# Patient Record
Sex: Female | Born: 1990 | Race: White | Hispanic: No | Marital: Single | Smoking: Never smoker
Health system: Southern US, Community
[De-identification: ages and names within clinical notes are randomized; demographics above are authoritative.]

---
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
## 2009-09-11 ENCOUNTER — Other Ambulatory Visit: Admission: RE | Admit: 2009-09-11 | Discharge: 2009-09-11 | Payer: Self-pay | Admitting: Obstetrics and Gynecology

## 2009-09-29 ENCOUNTER — Emergency Department (HOSPITAL_COMMUNITY): Admission: EM | Admit: 2009-09-29 | Discharge: 2009-09-29 | Payer: Self-pay | Admitting: Family Medicine

## 2010-07-11 ENCOUNTER — Other Ambulatory Visit: Admission: RE | Admit: 2010-07-11 | Discharge: 2010-07-11 | Payer: Self-pay | Admitting: Obstetrics and Gynecology

## 2010-08-29 ENCOUNTER — Emergency Department (HOSPITAL_COMMUNITY): Admission: EM | Admit: 2010-08-29 | Discharge: 2010-08-29 | Payer: Self-pay | Admitting: Family Medicine

## 2011-03-16 ENCOUNTER — Other Ambulatory Visit (HOSPITAL_COMMUNITY)
Admission: RE | Admit: 2011-03-16 | Discharge: 2011-03-16 | Disposition: A | Discharge status: Home / Self Care | Payer: BC Managed Care – PPO | Source: Ambulatory Visit | Attending: Obstetrics and Gynecology | Admitting: Obstetrics and Gynecology

## 2011-03-16 DIAGNOSIS — Z113 Encounter for screening for infections with a predominantly sexual mode of transmission: Secondary | ICD-10-CM | POA: Insufficient documentation

## 2011-03-16 DIAGNOSIS — Z01419 Encounter for gynecological examination (general) (routine) without abnormal findings: Secondary | ICD-10-CM | POA: Insufficient documentation

## 2011-03-16 DIAGNOSIS — M542 Cervicalgia: Secondary | ICD-10-CM | POA: Insufficient documentation

## 2012-03-10 ENCOUNTER — Other Ambulatory Visit (HOSPITAL_COMMUNITY)
Admission: RE | Admit: 2012-03-10 | Discharge: 2012-03-10 | Disposition: A | Discharge status: Home / Self Care | Payer: 59 | Source: Ambulatory Visit | Attending: Obstetrics and Gynecology | Admitting: Obstetrics and Gynecology

## 2012-03-10 DIAGNOSIS — Z113 Encounter for screening for infections with a predominantly sexual mode of transmission: Secondary | ICD-10-CM | POA: Insufficient documentation

## 2012-03-10 DIAGNOSIS — Z01419 Encounter for gynecological examination (general) (routine) without abnormal findings: Secondary | ICD-10-CM | POA: Insufficient documentation

## 2012-09-27 ENCOUNTER — Encounter (HOSPITAL_COMMUNITY): Payer: Self-pay | Admitting: *Deleted

## 2012-09-27 ENCOUNTER — Emergency Department (HOSPITAL_COMMUNITY)
Admission: EM | Admit: 2012-09-27 | Discharge: 2012-09-27 | Disposition: A | Discharge status: Home / Self Care | Payer: 59 | Source: Home / Self Care | Attending: Family Medicine | Admitting: Family Medicine

## 2012-09-27 DIAGNOSIS — J069 Acute upper respiratory infection, unspecified: Secondary | ICD-10-CM

## 2012-09-27 MED ORDER — AZITHROMYCIN 250 MG PO TABS: ORAL_TABLET | ORAL | Status: DC

## 2012-09-27 MED ORDER — DEXTROMETHORPHAN POLISTIREX 30 MG/5ML PO LQCR: 60.0000 mg | ORAL | Status: DC | PRN

## 2012-09-27 NOTE — ED Provider Notes (Signed)
History     CSN: [id number]  Arrival date & time 09/27/12  0802   First MD Initiated Contact with Patient 09/27/12 0809      Chief Complaint  Patient presents with  . Cough    (Consider location/radiation/quality/duration/timing/severity/associated sxs/prior treatment) Patient is a 21 y.o. female presenting with cough. The history is provided by the patient.  Cough This is a new problem. The current episode started more than 1 week ago (2 weeks of sx). The problem has not changed since onset.The cough is non-productive. There has been no fever. Pertinent negatives include no rhinorrhea, no sore throat, no shortness of breath and no wheezing. She is not a smoker.    History reviewed. No pertinent past medical history.  History reviewed. No pertinent past surgical history.  No family history on file.  History  Substance Use Topics  . Smoking status: Never Smoker   . Smokeless tobacco: Not on file  . Alcohol Use: Yes     Comment: occasonally    OB History    Grav Para Term Preterm Abortions TAB SAB Ect Mult Living                  Review of Systems  HENT: Negative for congestion, sore throat, rhinorrhea and postnasal drip.   Respiratory: Positive for cough. Negative for shortness of breath and wheezing.   Cardiovascular: Negative.   Gastrointestinal: Negative.     Allergies  Review of patient's allergies indicates not on file.  Home Medications   Current Outpatient Rx  Name  Route  Sig  Dispense  Refill  . DROSPIRENONE-ETHINYL ESTRADIOL 3-0.03 MG PO TABS   Oral   Take 1 tablet by mouth daily.         Marland Kitchen CLARITIN PO   Oral   Take by mouth.         . AZITHROMYCIN 250 MG PO TABS      Take as directed on pack   6 each   0   . DEXTROMETHORPHAN POLISTIREX ER 30 MG/5ML PO LQCR   Oral   Take 10 mLs (60 mg total) by mouth as needed for cough.   89 mL   0     BP 133/79  Pulse 95  Temp 99.2 F (37.3 C) (Oral)  Resp 12  SpO2 98%  Physical Exam   Nursing note and vitals reviewed. Constitutional: She is oriented to person, place, and time. She appears well-developed and well-nourished.  HENT:  Head: Normocephalic.  Right Ear: External ear normal.  Left Ear: External ear normal.  Mouth/Throat: Oropharynx is clear and moist.  Eyes: Pupils are equal, round, and reactive to light.  Neck: Normal range of motion. Neck supple.  Cardiovascular: Normal rate, regular rhythm, normal heart sounds and intact distal pulses.   Pulmonary/Chest: Effort normal and breath sounds normal.  Abdominal: Soft. Bowel sounds are normal.  Lymphadenopathy:    She has no cervical adenopathy.  Neurological: She is alert and oriented to person, place, and time.  Skin: Skin is warm and dry.    ED Course  Procedures (including critical care time)  Labs Reviewed - No data to display No results found.   1. URI (upper respiratory infection)       MDM          Linna Hoff, MD 09/27/12 [phone]

## 2012-09-27 NOTE — ED Notes (Signed)
Pt  Reports  Symptoms  Of a  Non  Productive   Cough  With sinus   Congestion and  Pressure     With a  scratchy  Throat        Symptoms   X  2  Weeks  - not releived  By  otc  meds

## 2013-03-27 ENCOUNTER — Other Ambulatory Visit (HOSPITAL_COMMUNITY)
Admission: RE | Admit: 2013-03-27 | Discharge: 2013-03-27 | Disposition: A | Discharge status: Home / Self Care | Payer: 59 | Source: Ambulatory Visit | Attending: Obstetrics and Gynecology | Admitting: Obstetrics and Gynecology

## 2013-03-27 ENCOUNTER — Other Ambulatory Visit: Payer: Self-pay | Admitting: Obstetrics and Gynecology

## 2013-03-27 DIAGNOSIS — Z01419 Encounter for gynecological examination (general) (routine) without abnormal findings: Secondary | ICD-10-CM | POA: Insufficient documentation

## 2014-04-05 ENCOUNTER — Other Ambulatory Visit: Payer: Self-pay | Admitting: Obstetrics and Gynecology

## 2014-04-05 ENCOUNTER — Other Ambulatory Visit (HOSPITAL_COMMUNITY)
Admission: RE | Admit: 2014-04-05 | Discharge: 2014-04-05 | Disposition: A | Discharge status: Home / Self Care | Payer: BC Managed Care – PPO | Source: Ambulatory Visit | Attending: Obstetrics and Gynecology | Admitting: Obstetrics and Gynecology

## 2014-04-05 DIAGNOSIS — Z113 Encounter for screening for infections with a predominantly sexual mode of transmission: Secondary | ICD-10-CM | POA: Insufficient documentation

## 2014-04-05 DIAGNOSIS — Z01419 Encounter for gynecological examination (general) (routine) without abnormal findings: Secondary | ICD-10-CM | POA: Insufficient documentation

## 2014-04-10 LAB — CYTOLOGY - PAP

## 2015-04-22 ENCOUNTER — Other Ambulatory Visit: Payer: Self-pay | Admitting: Obstetrics and Gynecology

## 2015-04-22 ENCOUNTER — Other Ambulatory Visit (HOSPITAL_COMMUNITY)
Admission: RE | Admit: 2015-04-22 | Discharge: 2015-04-22 | Disposition: A | Discharge status: Home / Self Care | Payer: BLUE CROSS/BLUE SHIELD | Source: Ambulatory Visit | Attending: Obstetrics and Gynecology | Admitting: Obstetrics and Gynecology

## 2015-04-22 DIAGNOSIS — Z01419 Encounter for gynecological examination (general) (routine) without abnormal findings: Secondary | ICD-10-CM | POA: Insufficient documentation

## 2015-04-23 LAB — CYTOLOGY - PAP

## 2016-04-21 ENCOUNTER — Other Ambulatory Visit (HOSPITAL_COMMUNITY)
Admission: RE | Admit: 2016-04-21 | Discharge: 2016-04-21 | Disposition: A | Discharge status: Home / Self Care | Payer: BLUE CROSS/BLUE SHIELD | Source: Ambulatory Visit | Attending: Obstetrics and Gynecology | Admitting: Obstetrics and Gynecology

## 2016-04-21 ENCOUNTER — Other Ambulatory Visit: Payer: Self-pay | Admitting: Obstetrics and Gynecology

## 2016-04-21 DIAGNOSIS — Z01419 Encounter for gynecological examination (general) (routine) without abnormal findings: Secondary | ICD-10-CM | POA: Insufficient documentation

## 2016-04-22 LAB — CYTOLOGY - PAP

## 2017-07-01 ENCOUNTER — Other Ambulatory Visit: Payer: Self-pay | Admitting: Obstetrics and Gynecology

## 2017-07-01 ENCOUNTER — Other Ambulatory Visit (HOSPITAL_COMMUNITY)
Admission: RE | Admit: 2017-07-01 | Discharge: 2017-07-01 | Disposition: A | Discharge status: Home / Self Care | Payer: BLUE CROSS/BLUE SHIELD | Source: Ambulatory Visit | Attending: Obstetrics and Gynecology | Admitting: Obstetrics and Gynecology

## 2017-07-01 DIAGNOSIS — Z01419 Encounter for gynecological examination (general) (routine) without abnormal findings: Secondary | ICD-10-CM | POA: Insufficient documentation

## 2017-07-02 LAB — CYTOLOGY - PAP: Diagnosis: NEGATIVE

## 2018-09-02 ENCOUNTER — Other Ambulatory Visit: Payer: Self-pay | Admitting: Obstetrics and Gynecology

## 2018-09-02 ENCOUNTER — Other Ambulatory Visit (HOSPITAL_COMMUNITY)
Admission: RE | Admit: 2018-09-02 | Discharge: 2018-09-02 | Disposition: A | Discharge status: Home / Self Care | Payer: BLUE CROSS/BLUE SHIELD | Source: Ambulatory Visit | Attending: Obstetrics and Gynecology | Admitting: Obstetrics and Gynecology

## 2018-09-02 DIAGNOSIS — Z01419 Encounter for gynecological examination (general) (routine) without abnormal findings: Secondary | ICD-10-CM | POA: Diagnosis not present

## 2018-09-06 LAB — CYTOLOGY - PAP: Diagnosis: NEGATIVE

## 2019-08-25 ENCOUNTER — Other Ambulatory Visit (HOSPITAL_COMMUNITY)
Admission: RE | Admit: 2019-08-25 | Discharge: 2019-08-25 | Disposition: A | Discharge status: Home / Self Care | Source: Ambulatory Visit | Attending: Obstetrics and Gynecology | Admitting: Obstetrics and Gynecology

## 2019-08-25 DIAGNOSIS — Z01419 Encounter for gynecological examination (general) (routine) without abnormal findings: Secondary | ICD-10-CM | POA: Insufficient documentation

## 2019-09-04 ENCOUNTER — Other Ambulatory Visit: Payer: Self-pay | Admitting: Obstetrics and Gynecology

## 2021-03-04 ENCOUNTER — Ambulatory Visit (INDEPENDENT_AMBULATORY_CARE_PROVIDER_SITE_OTHER): Admitting: Internal Medicine

## 2021-03-04 ENCOUNTER — Other Ambulatory Visit: Payer: Self-pay

## 2021-03-04 ENCOUNTER — Encounter: Payer: Self-pay | Admitting: Internal Medicine

## 2021-03-04 VITALS — BP 138/78 | HR 74 | Ht 62.0 in | Wt 142.4 lb

## 2021-03-04 DIAGNOSIS — R011 Cardiac murmur, unspecified: Secondary | ICD-10-CM

## 2021-03-04 NOTE — Progress Notes (Signed)
Cardiology Office Note:    Date:  03/04/2021   ID:  Laurie Barnes, DOB 1991-05-31, MRN [id number]  PCP:  Pcp, No  Cardiologist:  None  Electrophysiologist:  None   Referring MD: Gillis Ends, MD   Chief Complaint/Reason for Referral: Evaluation of heart murmur  History of Present Illness:    Laurie Barnes is a 30 y.o. female with a history of allergies and no cardiovascular history who presents for newly recognized systolic murmur.  This was diagnosed at urgent care when she presented with upper respiratory tract infection.  Today, she is feeling somewhat better since last Saturday. At that time she went to urgent care where she was told she had an upper respiratory infection. At this visit she has a cough. About once a month she experiences left breast pain that she attributes to her period. To her knowledge she does not snore. She denies any chest pain, shortness of breath, or palpitations. No lightheadedness, lower extremity edema, orthopnea or PND.  Last summer, she had a syncopal episode due to being out in the sun.  Suspect it was related to dehydration.  She currently works with [employer], mostly at a standing desk. She is not very active but tries to walk when she can.  Of note, she reports having recurrent strep throat throughout her childhood. Her mother was dx with a heart murmur 5 years ago. Her maternal grandfather had a stroke a few years ago. Her father has hypertension. No known accidental drownings in her family.   No past medical history on file.  No past surgical history on file.  Current Medications: Current Meds  Medication Sig  . cetirizine (ZYRTEC) 5 MG tablet Take by mouth.  . drospirenone-ethinyl estradiol (YASMIN,ZARAH,SYEDA) 3-0.03 MG tablet Take 1 tablet by mouth daily.  . [DISCONTINUED] azithromycin (ZITHROMAX Z-PAK) 250 MG tablet Take as directed on pack  . [DISCONTINUED] dextromethorphan (DELSYM) 30 MG/5ML liquid Take 10 mLs (60 mg total)  by mouth as needed for cough.  . [DISCONTINUED] drospirenone-ethinyl estradiol (YASMIN) 3-0.03 MG tablet Take 1 tablet by mouth daily.  . [DISCONTINUED] Loratadine (CLARITIN PO) Take by mouth.     Allergies:   Patient has no allergy information on record.   Social History   Tobacco Use  . Smoking status: Never Smoker  . Smokeless tobacco: Never Used  Substance Use Topics  . Alcohol use: Yes    Comment: occasonally  . Drug use: Never     Family History: The patient's family history is not on file.  ROS:   Please see the history of present illness.    (+) Cough (+) Breast pain (+) Syncope All other systems reviewed and are negative.  EKGs/Labs/Other Studies Reviewed:    The following studies were reviewed today:  EKG:   03/04/2021: NSR,sinus arrhythmia, rate 74 bpm   Recent Labs: No results found for requested labs within last 8760 hours.  Recent Lipid Panel No results found for: CHOL, TRIG, HDL, CHOLHDL, VLDL, LDLCALC, LDLDIRECT  Physical Exam:    VS:  BP 138/78   Pulse 74   Ht 5\' 2"  (1.575 m)   Wt 142 lb 6.4 oz (64.6 kg)   SpO2 99%   BMI 26.05 kg/m     Wt Readings from Last 5 Encounters:  03/04/21 142 lb 6.4 oz (64.6 kg)    Constitutional: No acute distress Eyes: sclera non-icteric, normal conjunctiva and lids ENMT: normal dentition, moist mucous membranes Cardiovascular: regular rhythm, normal rate, 2/6 mid peaking systolic  murmurs. S1 and S2 normal. Radial pulses normal bilaterally. No jugular venous distention.  Respiratory: clear to auscultation bilaterally GI : normal bowel sounds, soft and nontender. No distention.   MSK: extremities warm, well perfused. No edema.  NEURO: grossly nonfocal exam, moves all extremities. PSYCH: alert and oriented x 3, normal mood and affect.   ASSESSMENT:    1. Systolic murmur    PLAN:    Systolic murmur - Plan: EKG 12-Lead, ECHOCARDIOGRAM COMPLETE  I suspect her murmur is related to a flow murmur and we  discussed the benign etiology of this.  I do not have recent laboratory testing to assess for anemia or iron deficiency as a contributor.  She does note recurrent strep infections in her youth and a mother with a murmur diagnosed 5 years ago.  For these reasons we will evaluate her valves for any congenital lesions, and will follow-up afterwards.  She has no cardiovascular symptoms otherwise, no further testing required at this time.  [name], MD, [facility] [place]  [facility]     Medication Adjustments/Labs and Tests Ordered: Current medicines are reviewed at length with the patient today.  Concerns regarding medicines are outlined above.   Orders Placed This Encounter  Procedures  . EKG 12-Lead  . ECHOCARDIOGRAM COMPLETE    No orders of the defined types were placed in this encounter.   Patient Instructions  Medication Instructions:  No Changes In Medications at this time.  *If you need a refill on your cardiac medications before your next appointment, please call your pharmacy*  Testing/Procedures: Your physician has requested that you have an echocardiogram. Echocardiography is a painless test that uses sound waves to create images of your heart. It provides your doctor with information about the size and shape of your heart and how well your heart's chambers and valves are working. You may receive an ultrasound enhancing agent through an IV if needed to better visualize your heart during the echo.This procedure takes approximately one hour. There are no restrictions for this procedure. This will take place at the [address], [address].   Follow-Up: At [facility], you and your health needs are our priority.  As part of our continuing mission to provide you with exceptional heart care, we have created designated Provider Care Teams.  These Care Teams include your primary Cardiologist (physician) and Advanced Practice Providers (APPs -  Physician Assistants and  Nurse Practitioners) who all work together to provide you with the care you need, when you need it.  We recommend signing up for the patient portal called "MyChart".  Sign up information is provided on this After Visit Summary.  MyChart is used to connect with patients for Virtual Visits (Telemedicine).  Patients are able to view lab/test results, encounter notes, upcoming appointments, etc.  Non-urgent messages can be sent to your provider as well.   To learn more about what you can do with MyChart, go to [email or web address].    Your next appointment:   AS NEEDED   The format for your next appointment:   In Person  Provider:   [name], MD      [facility],acting as a scribe for Parke Poisson, MD.,have documented all relevant documentation on the behalf of Parke Poisson, MD,as directed by  Parke Poisson, MD while in the presence of Parke Poisson, MD.  I, Parke Poisson, MD, have reviewed all documentation for this visit. The documentation on 03/04/21 for the exam,  diagnosis, procedures, and orders are all accurate and complete.

## 2021-03-04 NOTE — Patient Instructions (Signed)
Medication Instructions:  No Changes In Medications at this time.  *If you need a refill on your cardiac medications before your next appointment, please call your pharmacy*  Testing/Procedures: Your physician has requested that you have an echocardiogram. Echocardiography is a painless test that uses sound waves to create images of your heart. It provides your doctor with information about the size and shape of your heart and how well your heart's chambers and valves are working. You may receive an ultrasound enhancing agent through an IV if needed to better visualize your heart during the echo.This procedure takes approximately one hour. There are no restrictions for this procedure. This will take place at the [address].   Follow-Up: At [facility], you and your health needs are our priority.  As part of our continuing mission to provide you with exceptional heart care, we have created designated Provider Care Teams.  These Care Teams include your primary Cardiologist (physician) and Advanced Practice Providers (APPs -  Physician Assistants and Nurse Practitioners) who all work together to provide you with the care you need, when you need it.  We recommend signing up for the patient portal called "MyChart".  Sign up information is provided on this After Visit Summary.  MyChart is used to connect with patients for Virtual Visits (Telemedicine).  Patients are able to view lab/test results, encounter notes, upcoming appointments, etc.  Non-urgent messages can be sent to your provider as well.   To learn more about what you can do with MyChart, go to [email or web address].    Your next appointment:   AS NEEDED   The format for your next appointment:   In Person  Provider:   [name], MD

## 2021-04-03 ENCOUNTER — Ambulatory Visit (HOSPITAL_COMMUNITY): Attending: Cardiology

## 2021-04-03 ENCOUNTER — Other Ambulatory Visit: Payer: Self-pay

## 2021-04-03 DIAGNOSIS — R011 Cardiac murmur, unspecified: Secondary | ICD-10-CM | POA: Diagnosis not present

## 2021-04-03 LAB — ECHOCARDIOGRAM COMPLETE
Area-P 1/2: 3.33 cm2
S' Lateral: 2.7 cm

## 2021-04-07 ENCOUNTER — Encounter: Payer: Self-pay | Admitting: *Deleted

## 2021-11-14 ENCOUNTER — Other Ambulatory Visit: Payer: Self-pay | Admitting: Specialist

## 2021-11-14 DIAGNOSIS — G43909 Migraine, unspecified, not intractable, without status migrainosus: Secondary | ICD-10-CM

## 2021-12-15 ENCOUNTER — Other Ambulatory Visit: Payer: Self-pay

## 2021-12-15 ENCOUNTER — Ambulatory Visit
Admission: RE | Admit: 2021-12-15 | Discharge: 2021-12-15 | Disposition: A | Discharge status: Home / Self Care | Source: Ambulatory Visit | Attending: Specialist | Admitting: Specialist

## 2021-12-15 DIAGNOSIS — G43909 Migraine, unspecified, not intractable, without status migrainosus: Secondary | ICD-10-CM

## 2023-02-23 IMAGING — MR MR HEAD W/O CM
10 series · 48 of 48 positions shown · non-contrast
Comparison: No pertinent prior exams available for comparison.

CLINICAL DATA: Migraine without status migrainosus, not
intractable, unspecified migraine type. Additional history provided
by scanning technologist: Patient reports chronic migraine headaches
for several years.

EXAM:
MRI HEAD WITHOUT CONTRAST
TECHNIQUE: Multiplanar, multiecho pulse sequences of the brain and surrounding
structures were obtained without intravenous contrast.

[Series 2: T1 · sagittal · 5.0mm · 0.45mm/px · 2 of 21 slices shown]
[im 1/21]
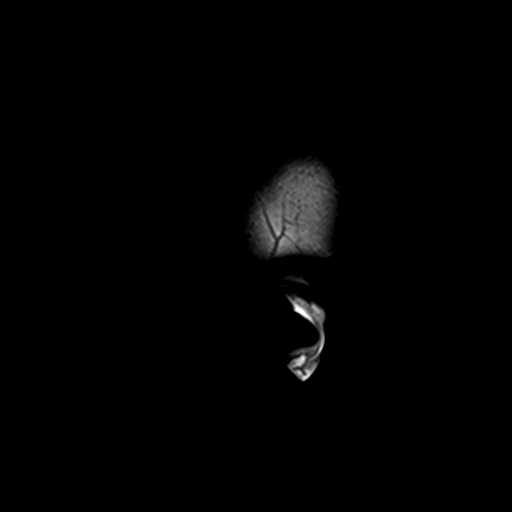
[im 21/21]
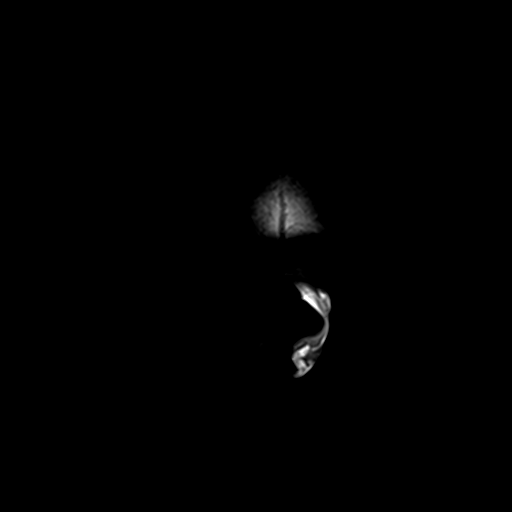

[Series 3: DWI · axial · 3.0mm · 1.80mm/px · z∈[-82,+59]mm · 9 of 100 slices shown (1 of 4)]
[im 1/100]
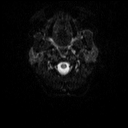
[im 13/100]
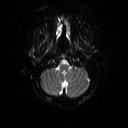
[im 25/100]
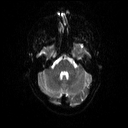
[im 38/100]
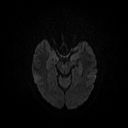
[im 50/100]
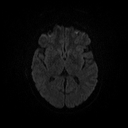
[im 62/100]
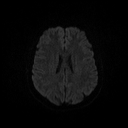
[im 75/100]
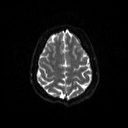
[im 87/100]
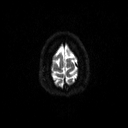
[im 100/100]
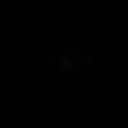

[Series 4: DWI · axial · 3.0mm · 1.80mm/px · z∈[-82,+59]mm · 5 of 50 slices shown (2 of 4)]
[im 1/50]
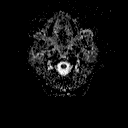
[im 13/50]
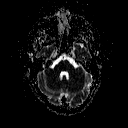
[im 25/50]
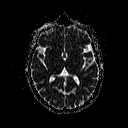
[im 37/50]
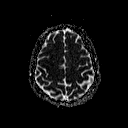
[im 50/50]
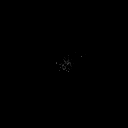

[Series 5: DWI · coronal · 5.0mm · 1.80mm/px · 6 of 68 slices shown (3 of 4)]
[im 1/68]
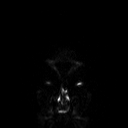
[im 14/68]
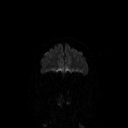
[im 27/68]
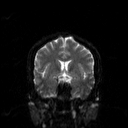
[im 41/68]
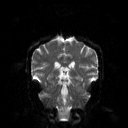
[im 54/68]
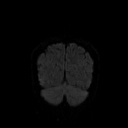
[im 68/68]
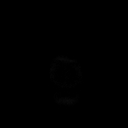

[Series 6: DWI · coronal · 5.0mm · 1.80mm/px · 3 of 34 slices shown (4 of 4)]
[im 1/34]
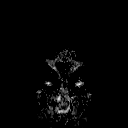
[im 17/34]
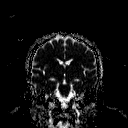
[im 34/34]
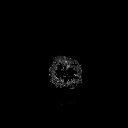

[Series 7: T2 · axial · 5.0mm · 0.51mm/px · z∈[-82,+60]mm · 2 of 22 slices shown (1 of 2)]
[im 1/22]
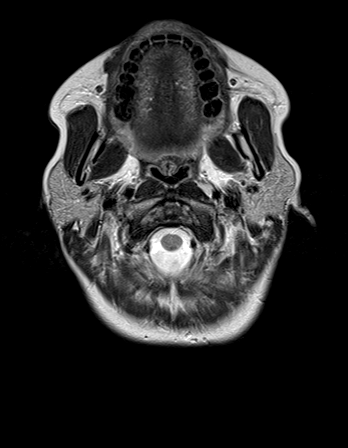
[im 22/22]
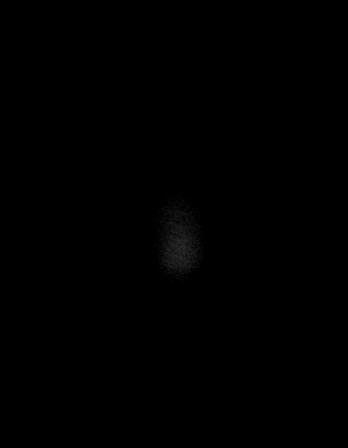

[Series 8: FLAIR · axial · 3.0mm · 0.45mm/px · z∈[-81,+58]mm · 3 of 32 slices shown]
[im 1/32]
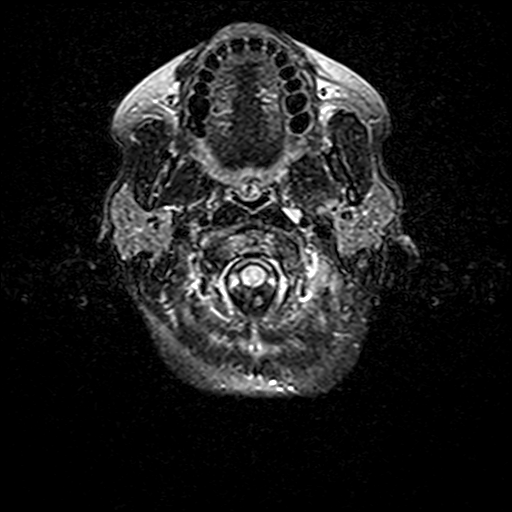
[im 16/32]
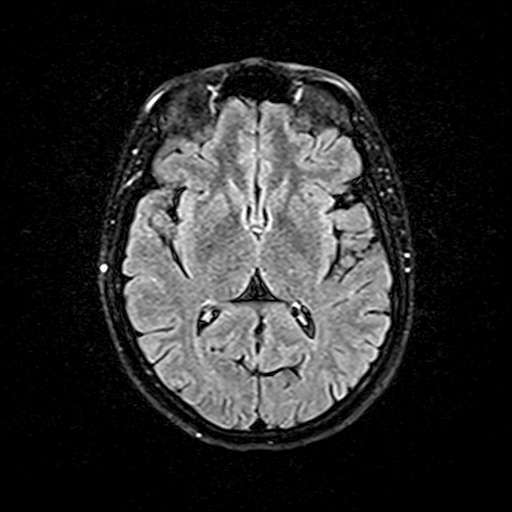
[im 32/32]
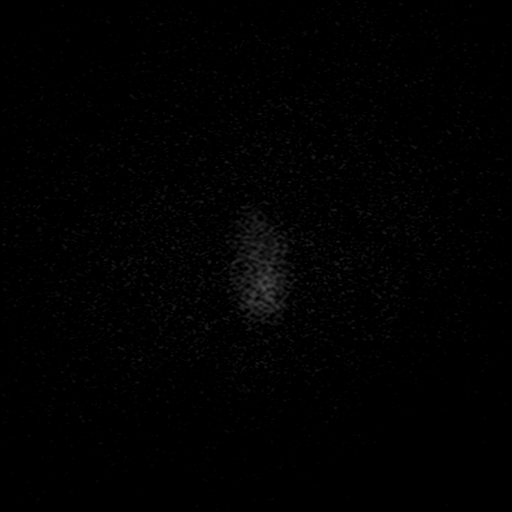

[Series 10: swi_images · axial · 4.0mm · 0.90mm/px · z∈[-79,+56]mm · 3 of 36 slices shown]
[im 1/36]
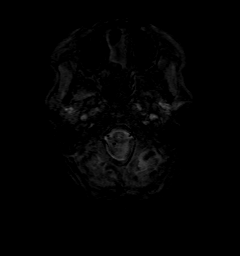
[im 18/36]
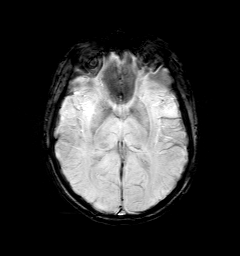
[im 36/36]
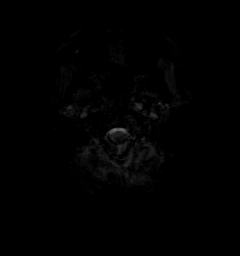

[Series 11: t1_mpr_tra · axial · 1.0mm · 0.71mm/px · z∈[-80,+58]mm · 13 of 144 slices shown]
[im 1/144]
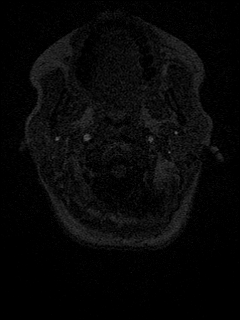
[im 12/144]
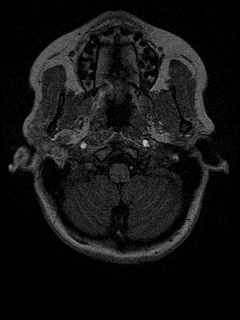
[im 24/144]
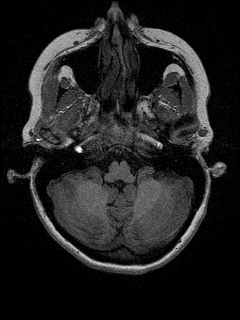
[im 36/144]
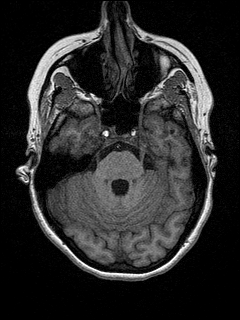
[im 48/144]
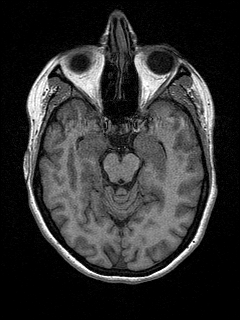
[im 60/144]
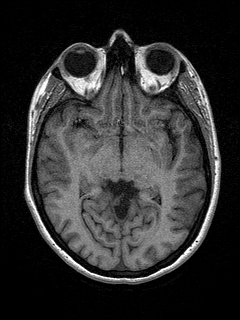
[im 72/144]
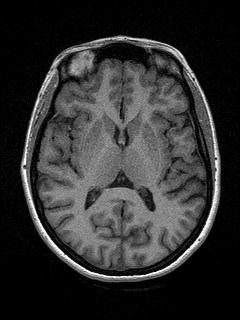
[im 84/144]
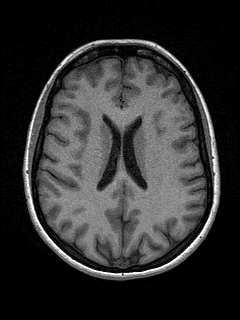
[im 96/144]
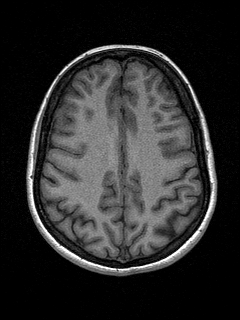
[im 108/144]
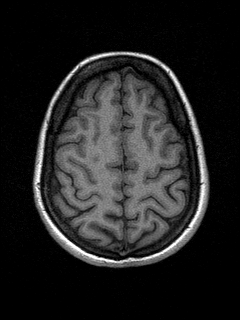
[im 120/144]
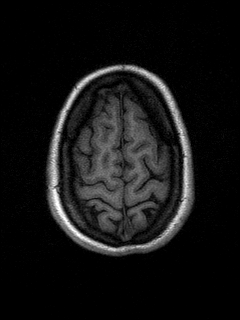
[im 132/144]
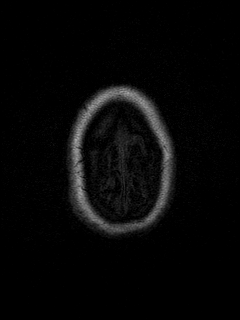
[im 144/144]
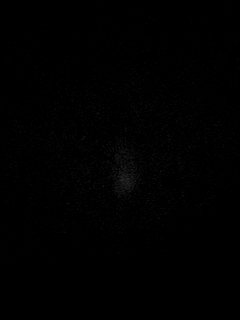

[Series 12: T2 · coronal · 5.0mm · 0.45mm/px · 2 of 25 slices shown (2 of 2)]
[im 1/25]
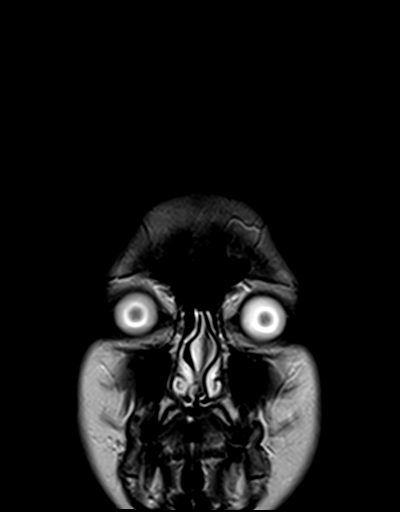
[im 25/25]
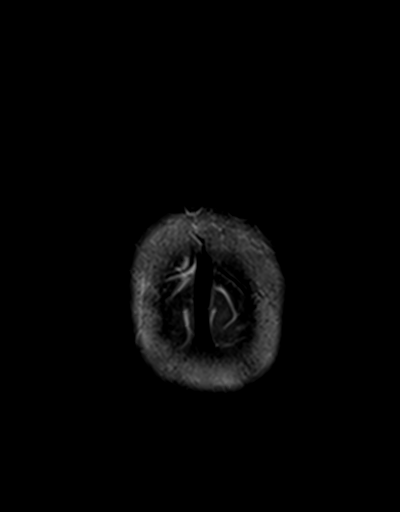

[48 of 48 positions shown; findings below may reference images not displayed]

FINDINGS: Brain:

Cerebral volume is normal.

No cortical encephalomalacia is identified. No significant cerebral
white matter disease.

There is no acute infarct.

No evidence of an intracranial mass.

No chronic intracranial blood products.

No extra-axial fluid collection.

No midline shift.

Vascular: Maintained flow voids within the proximal large arterial
vessels.

Skull and upper cervical spine: No focal suspicious marrow lesion.

Sinuses/Orbits: Visualized orbits show no acute finding. No
significant paranasal sinus disease.
IMPRESSION: Unremarkable non-contrast MRI appearance of the brain. No evidence
of acute intracranial abnormality.

## 2023-09-28 ENCOUNTER — Other Ambulatory Visit (HOSPITAL_COMMUNITY)
Admission: RE | Admit: 2023-09-28 | Discharge: 2023-09-28 | Disposition: A | Discharge status: Home / Self Care | Source: Ambulatory Visit | Attending: Obstetrics and Gynecology | Admitting: Obstetrics and Gynecology

## 2023-09-28 ENCOUNTER — Other Ambulatory Visit: Payer: Self-pay | Admitting: Obstetrics and Gynecology

## 2023-09-28 DIAGNOSIS — Z01419 Encounter for gynecological examination (general) (routine) without abnormal findings: Secondary | ICD-10-CM | POA: Insufficient documentation

## 2023-09-30 LAB — CYTOLOGY - PAP
Comment: NEGATIVE
Diagnosis: NEGATIVE
High risk HPV: NEGATIVE

## 2023-11-30 ENCOUNTER — Telehealth (HOSPITAL_COMMUNITY): Payer: Self-pay | Admitting: *Deleted

## 2023-11-30 NOTE — Telephone Encounter (Signed)
 OPEN IN ERROR

## 2023-11-30 NOTE — Telephone Encounter (Signed)
Rx REFILL REQUEST
